# Patient Record
Sex: Female | Born: 1950
Health system: Southern US, Community
[De-identification: ages and names within clinical notes are randomized; demographics above are authoritative.]

---

## 2000-01-22 ENCOUNTER — Encounter: Admission: RE | Admit: 2000-01-22 | Discharge: 2000-01-22 | Payer: Self-pay | Admitting: Obstetrics and Gynecology

## 2000-01-22 ENCOUNTER — Encounter: Payer: Self-pay | Admitting: Obstetrics and Gynecology

## 2000-03-12 ENCOUNTER — Encounter: Admission: RE | Admit: 2000-03-12 | Discharge: 2000-03-12 | Payer: Self-pay | Admitting: Family Medicine

## 2000-03-12 ENCOUNTER — Encounter: Payer: Self-pay | Admitting: Family Medicine

## 2000-09-02 ENCOUNTER — Ambulatory Visit (HOSPITAL_COMMUNITY): Admission: RE | Admit: 2000-09-02 | Discharge: 2000-09-02 | Payer: Self-pay | Admitting: Gastroenterology

## 2001-02-25 ENCOUNTER — Encounter: Admission: RE | Admit: 2001-02-25 | Discharge: 2001-02-25 | Payer: Self-pay | Admitting: Obstetrics and Gynecology

## 2001-02-25 ENCOUNTER — Encounter: Payer: Self-pay | Admitting: Obstetrics and Gynecology

## 2015-12-26 DIAGNOSIS — H8110 Benign paroxysmal vertigo, unspecified ear: Secondary | ICD-10-CM | POA: Insufficient documentation

## 2015-12-26 DIAGNOSIS — M8580 Other specified disorders of bone density and structure, unspecified site: Secondary | ICD-10-CM | POA: Insufficient documentation

## 2015-12-26 DIAGNOSIS — E039 Hypothyroidism, unspecified: Secondary | ICD-10-CM | POA: Insufficient documentation

## 2015-12-26 DIAGNOSIS — E559 Vitamin D deficiency, unspecified: Secondary | ICD-10-CM | POA: Insufficient documentation

## 2016-04-11 DIAGNOSIS — Z0000 Encounter for general adult medical examination without abnormal findings: Secondary | ICD-10-CM | POA: Insufficient documentation

## 2016-05-13 ENCOUNTER — Ambulatory Visit
Admission: RE | Admit: 2016-05-13 | Discharge: 2016-05-13 | Disposition: A | Discharge status: Home / Self Care | Payer: BLUE CROSS/BLUE SHIELD | Source: Ambulatory Visit | Attending: Physical Medicine and Rehabilitation | Admitting: Physical Medicine and Rehabilitation

## 2016-05-13 ENCOUNTER — Other Ambulatory Visit: Payer: Self-pay | Admitting: Physical Medicine and Rehabilitation

## 2016-05-13 DIAGNOSIS — M5431 Sciatica, right side: Secondary | ICD-10-CM

## 2016-06-05 DIAGNOSIS — R69 Illness, unspecified: Secondary | ICD-10-CM | POA: Diagnosis not present

## 2016-06-05 DIAGNOSIS — M545 Low back pain: Secondary | ICD-10-CM | POA: Diagnosis not present

## 2016-06-05 DIAGNOSIS — E2839 Other primary ovarian failure: Secondary | ICD-10-CM | POA: Diagnosis not present

## 2016-06-05 DIAGNOSIS — N951 Menopausal and female climacteric states: Secondary | ICD-10-CM | POA: Diagnosis not present

## 2016-06-05 DIAGNOSIS — G894 Chronic pain syndrome: Secondary | ICD-10-CM | POA: Diagnosis not present

## 2016-06-05 DIAGNOSIS — N941 Unspecified dyspareunia: Secondary | ICD-10-CM | POA: Diagnosis not present

## 2016-06-05 DIAGNOSIS — M47817 Spondylosis without myelopathy or radiculopathy, lumbosacral region: Secondary | ICD-10-CM | POA: Diagnosis not present

## 2016-07-10 DIAGNOSIS — E2749 Other adrenocortical insufficiency: Secondary | ICD-10-CM | POA: Diagnosis not present

## 2016-07-10 DIAGNOSIS — E2839 Other primary ovarian failure: Secondary | ICD-10-CM | POA: Diagnosis not present

## 2016-07-10 DIAGNOSIS — E038 Other specified hypothyroidism: Secondary | ICD-10-CM | POA: Diagnosis not present

## 2016-07-18 DIAGNOSIS — E2839 Other primary ovarian failure: Secondary | ICD-10-CM | POA: Diagnosis not present

## 2016-07-18 DIAGNOSIS — M545 Low back pain: Secondary | ICD-10-CM | POA: Diagnosis not present

## 2016-07-18 DIAGNOSIS — M47817 Spondylosis without myelopathy or radiculopathy, lumbosacral region: Secondary | ICD-10-CM | POA: Diagnosis not present

## 2016-07-18 DIAGNOSIS — N951 Menopausal and female climacteric states: Secondary | ICD-10-CM | POA: Diagnosis not present

## 2016-07-18 DIAGNOSIS — M79651 Pain in right thigh: Secondary | ICD-10-CM | POA: Diagnosis not present

## 2016-07-18 DIAGNOSIS — G894 Chronic pain syndrome: Secondary | ICD-10-CM | POA: Diagnosis not present

## 2016-07-18 DIAGNOSIS — N941 Unspecified dyspareunia: Secondary | ICD-10-CM | POA: Diagnosis not present

## 2016-07-31 DIAGNOSIS — Z23 Encounter for immunization: Secondary | ICD-10-CM | POA: Diagnosis not present

## 2016-08-07 DIAGNOSIS — Z86010 Personal history of colonic polyps: Secondary | ICD-10-CM | POA: Diagnosis not present

## 2016-08-21 DIAGNOSIS — E2839 Other primary ovarian failure: Secondary | ICD-10-CM | POA: Diagnosis not present

## 2016-08-21 DIAGNOSIS — E038 Other specified hypothyroidism: Secondary | ICD-10-CM | POA: Diagnosis not present

## 2016-08-21 DIAGNOSIS — E663 Overweight: Secondary | ICD-10-CM | POA: Diagnosis not present

## 2016-08-21 DIAGNOSIS — E2749 Other adrenocortical insufficiency: Secondary | ICD-10-CM | POA: Diagnosis not present

## 2017-04-16 DIAGNOSIS — Z1231 Encounter for screening mammogram for malignant neoplasm of breast: Secondary | ICD-10-CM | POA: Diagnosis not present

## 2017-04-16 DIAGNOSIS — Z6824 Body mass index (BMI) 24.0-24.9, adult: Secondary | ICD-10-CM | POA: Diagnosis not present

## 2017-04-16 DIAGNOSIS — E559 Vitamin D deficiency, unspecified: Secondary | ICD-10-CM | POA: Diagnosis not present

## 2017-04-16 DIAGNOSIS — Z01419 Encounter for gynecological examination (general) (routine) without abnormal findings: Secondary | ICD-10-CM | POA: Diagnosis not present

## 2017-04-16 DIAGNOSIS — Z0000 Encounter for general adult medical examination without abnormal findings: Secondary | ICD-10-CM | POA: Diagnosis not present

## 2017-04-21 DIAGNOSIS — Z0000 Encounter for general adult medical examination without abnormal findings: Secondary | ICD-10-CM | POA: Diagnosis not present

## 2017-04-21 DIAGNOSIS — Z23 Encounter for immunization: Secondary | ICD-10-CM | POA: Diagnosis not present

## 2017-06-07 IMAGING — CR DG LUMBAR SPINE COMPLETE W/ BEND
7 series · 7 of 7 positions shown · non-contrast
Comparison: No recent prior.

CLINICAL DATA: Back pain.

EXAM:
LUMBAR SPINE - COMPLETE WITH BENDING VIEWS

[w lumbar spine ap]
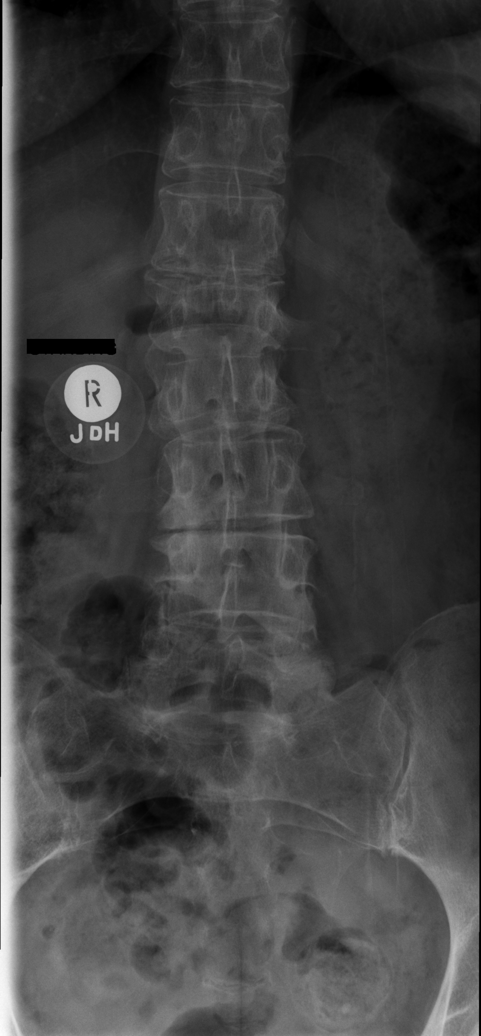

[w lumbar spine obl (1 of 2)]
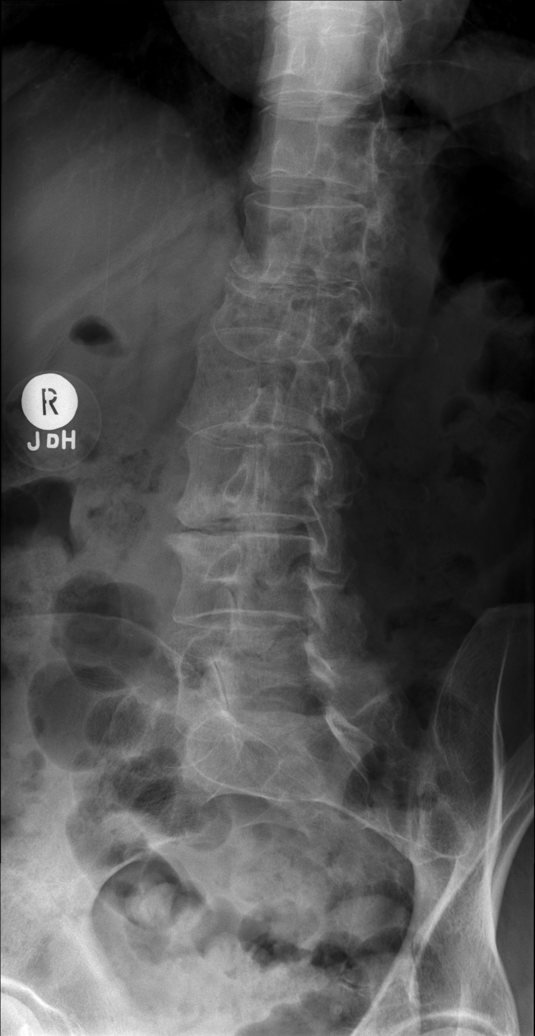

[w lumbar spine obl (2 of 2)]
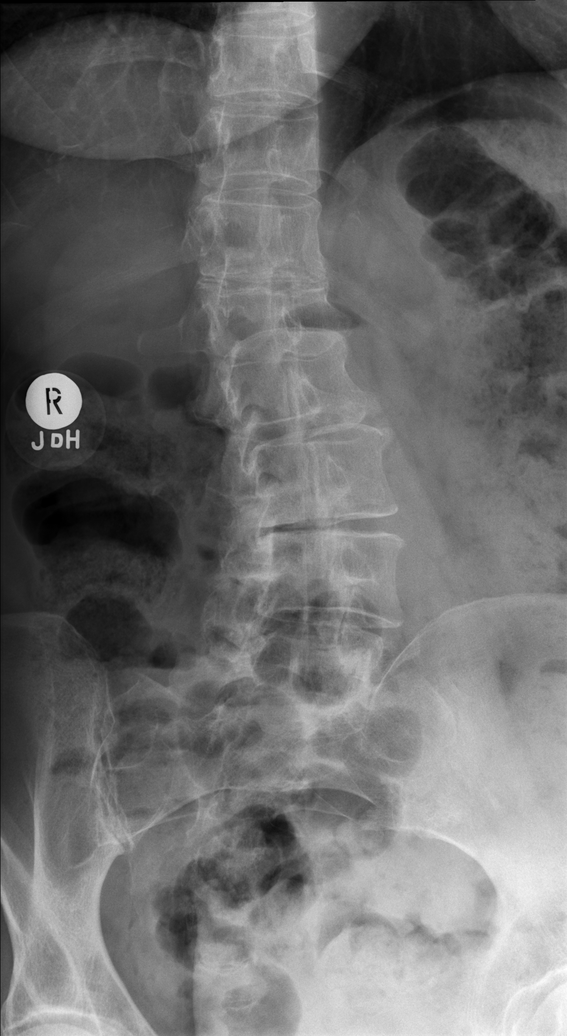

[w lumbar spine lat]
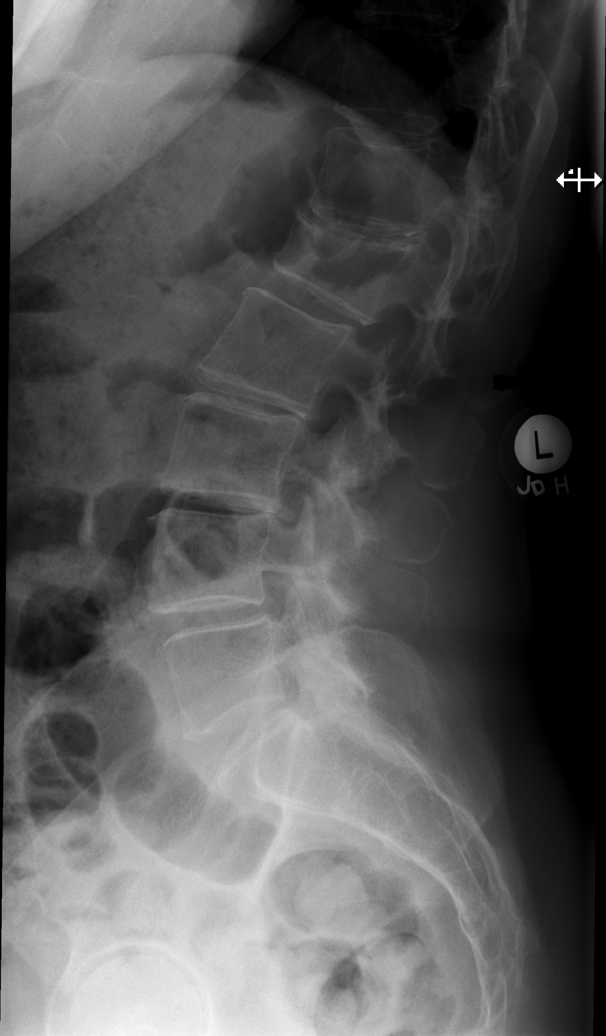

[w lumbar l-5 s-1 spot]
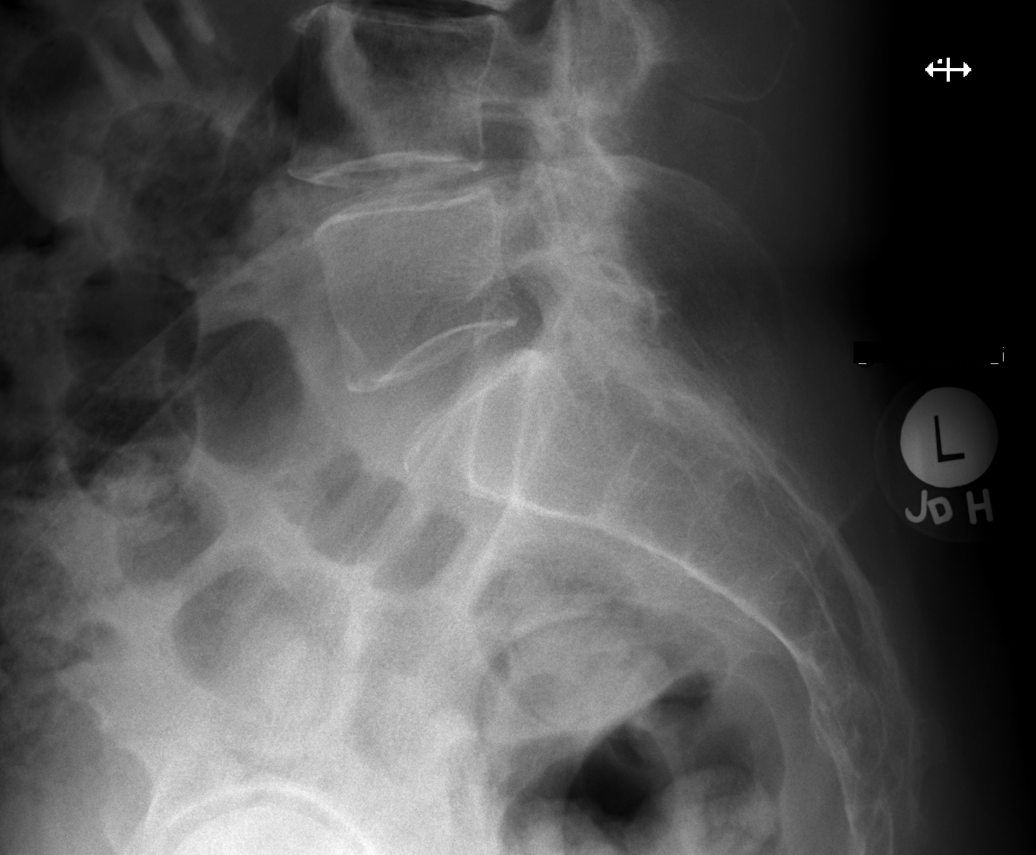

[w lumbar spine flexion]
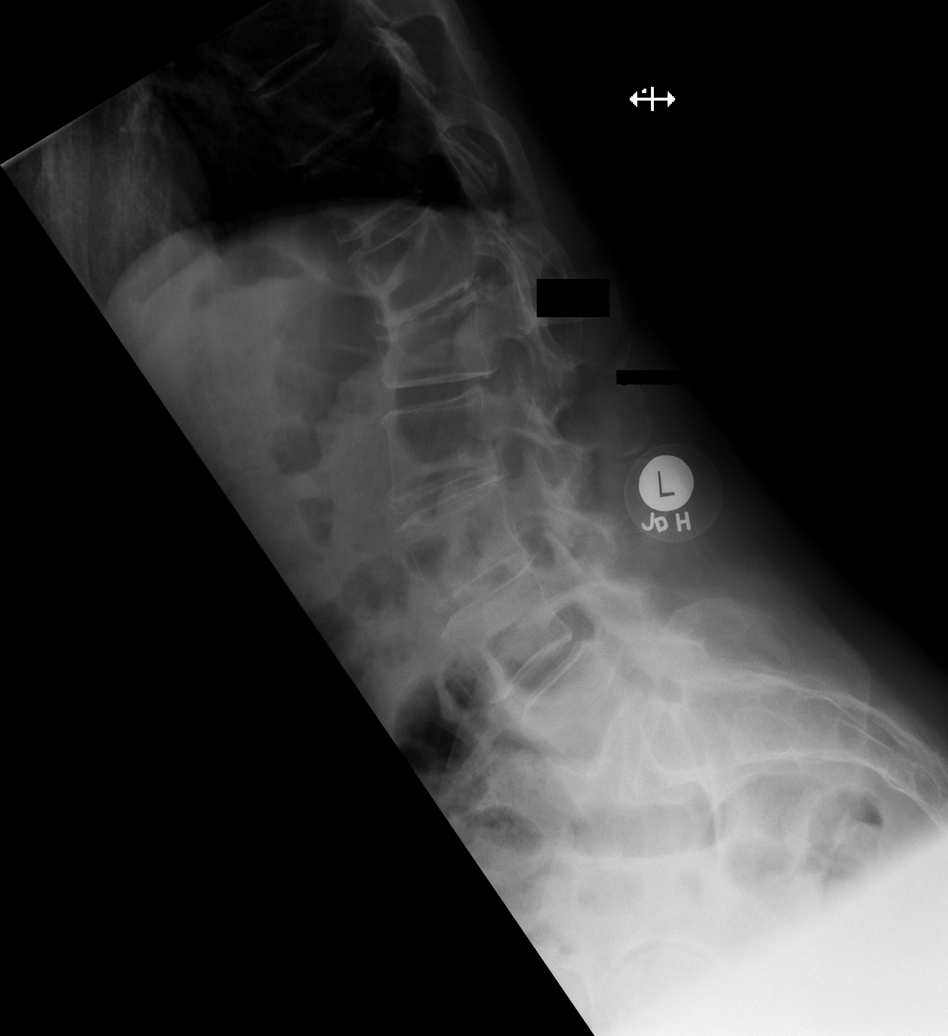

[w lumbar spine extension]
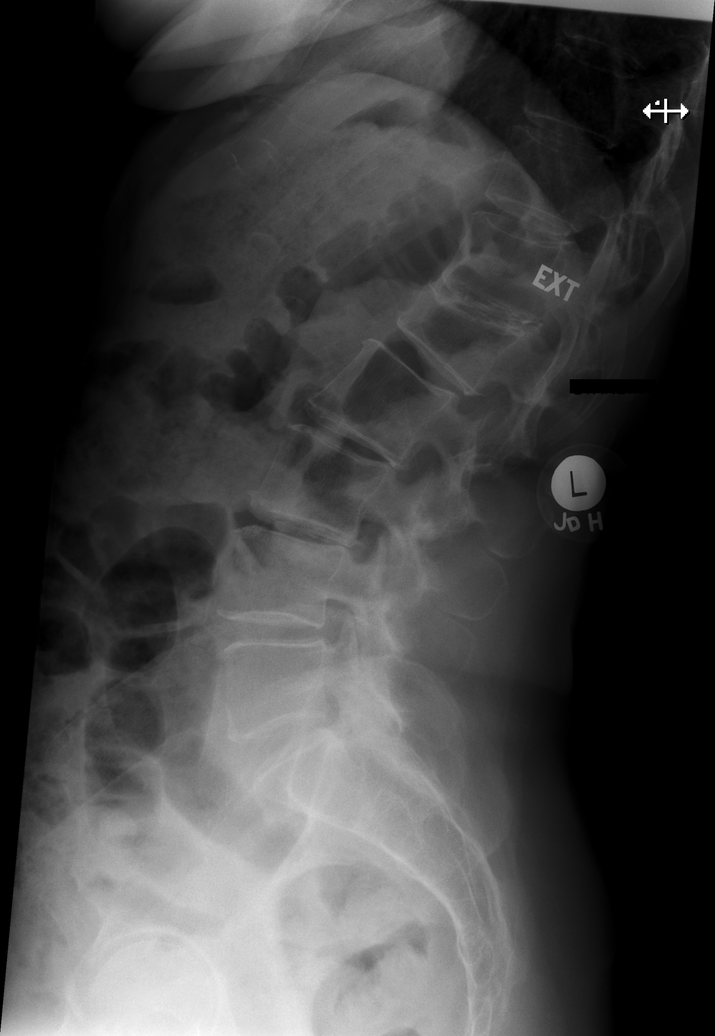

[7 of 7 positions shown; findings below may reference images not displayed]

FINDINGS: Lumbar vertebra numbered with the lowest segmented appearing lumbar
vertebral lateral view as L5. Lumbar scoliosis concave left. Mild
moderate L1 compression fracture, age undetermined. Diffuse
multilevel severe degenerative change. 3 mm anterolisthesis L4 on
L5. No flexion or extension instability noted.
IMPRESSION: 1. Diffuse multilevel degenerative change and scoliosis concave
left. 3 mm anterolisthesis L4 on L5. No flexion or extension
instability noted.

2.  L1 mild moderate compression fracture, age undetermined .

## 2017-06-16 DIAGNOSIS — H8110 Benign paroxysmal vertigo, unspecified ear: Secondary | ICD-10-CM | POA: Diagnosis not present

## 2017-07-09 DIAGNOSIS — M8589 Other specified disorders of bone density and structure, multiple sites: Secondary | ICD-10-CM | POA: Diagnosis not present

## 2017-09-08 DIAGNOSIS — N898 Other specified noninflammatory disorders of vagina: Secondary | ICD-10-CM | POA: Diagnosis not present

## 2017-09-08 DIAGNOSIS — N930 Postcoital and contact bleeding: Secondary | ICD-10-CM | POA: Diagnosis not present

## 2017-09-16 DIAGNOSIS — N930 Postcoital and contact bleeding: Secondary | ICD-10-CM | POA: Diagnosis not present

## 2017-09-16 DIAGNOSIS — N950 Postmenopausal bleeding: Secondary | ICD-10-CM | POA: Diagnosis not present

## 2017-11-24 DIAGNOSIS — N930 Postcoital and contact bleeding: Secondary | ICD-10-CM | POA: Diagnosis not present

## 2017-11-24 DIAGNOSIS — N950 Postmenopausal bleeding: Secondary | ICD-10-CM | POA: Diagnosis not present

## 2018-01-22 DIAGNOSIS — J309 Allergic rhinitis, unspecified: Secondary | ICD-10-CM | POA: Diagnosis not present

## 2018-01-22 DIAGNOSIS — R05 Cough: Secondary | ICD-10-CM | POA: Diagnosis not present

## 2018-04-22 DIAGNOSIS — Z01419 Encounter for gynecological examination (general) (routine) without abnormal findings: Secondary | ICD-10-CM | POA: Diagnosis not present

## 2018-04-22 DIAGNOSIS — Z1231 Encounter for screening mammogram for malignant neoplasm of breast: Secondary | ICD-10-CM | POA: Diagnosis not present

## 2018-04-22 DIAGNOSIS — R69 Illness, unspecified: Secondary | ICD-10-CM | POA: Diagnosis not present

## 2018-04-30 DIAGNOSIS — E039 Hypothyroidism, unspecified: Secondary | ICD-10-CM | POA: Diagnosis not present

## 2018-04-30 DIAGNOSIS — E559 Vitamin D deficiency, unspecified: Secondary | ICD-10-CM | POA: Diagnosis not present

## 2018-04-30 DIAGNOSIS — Z0000 Encounter for general adult medical examination without abnormal findings: Secondary | ICD-10-CM | POA: Diagnosis not present

## 2018-05-06 DIAGNOSIS — R2242 Localized swelling, mass and lump, left lower limb: Secondary | ICD-10-CM | POA: Insufficient documentation

## 2018-05-06 DIAGNOSIS — Z0000 Encounter for general adult medical examination without abnormal findings: Secondary | ICD-10-CM | POA: Diagnosis not present

## 2018-05-15 ENCOUNTER — Ambulatory Visit: Payer: 59 | Admitting: Podiatry

## 2018-05-27 ENCOUNTER — Ambulatory Visit: Payer: 59 | Admitting: Podiatry

## 2018-05-27 ENCOUNTER — Encounter: Payer: Self-pay | Admitting: Podiatry

## 2018-05-27 ENCOUNTER — Other Ambulatory Visit: Payer: Self-pay | Admitting: Podiatry

## 2018-05-27 ENCOUNTER — Ambulatory Visit (INDEPENDENT_AMBULATORY_CARE_PROVIDER_SITE_OTHER): Payer: 59

## 2018-05-27 VITALS — BP 96/63 | HR 72 | Resp 16

## 2018-05-27 DIAGNOSIS — M722 Plantar fascial fibromatosis: Secondary | ICD-10-CM

## 2018-05-27 DIAGNOSIS — M79672 Pain in left foot: Secondary | ICD-10-CM

## 2018-05-27 DIAGNOSIS — M2041 Other hammer toe(s) (acquired), right foot: Secondary | ICD-10-CM

## 2018-05-27 NOTE — Progress Notes (Signed)
   Subjective:    Patient ID: Deborah Brady, female    DOB: Nov 29, 1950, 67 y.o.   MRN: [id number]  HPI    Review of Systems  All other systems reviewed and are negative.      Objective:   Physical Exam        Assessment & Plan:

## 2018-05-28 NOTE — Progress Notes (Signed)
Subjective:   Patient ID: Deborah Brady, female   DOB: 67 y.o.   MRN: [id number]   HPI Patient presents stating that she has a nodule in the left arch that is been present for around a year and while it does not hurt at times her foot feels tight.  Patient does not smoke and likes to be active   Review of Systems  All other systems reviewed and are negative.       Objective:  Physical Exam  Constitutional: She appears well-developed and well-nourished.  Cardiovascular: Intact distal pulses.  Pulmonary/Chest: Effort normal.  Musculoskeletal: Normal range of motion.  Neurological: She is alert.  Skin: Skin is warm.  Nursing note and vitals reviewed.   Neurovascular status intact muscle strength is adequate range of motion within normal limits with patient found to have a nodule in the left mid arch area that does not have any color changes and is not painful when palpated except for deep deep palpation which is localized.  It measures 1.8 cm in width by 2.3 cm in length and it is local to this area with no proximal indication of extension.  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  I did explain that we could remove it for MRI and she denies wanting either of these treatments and will watch it and if it grows in size changes color or becomes painful it will need to be dealt with  X-ray indicates that there is no calcification or indications of bony extension

## 2019-05-13 DIAGNOSIS — E559 Vitamin D deficiency, unspecified: Secondary | ICD-10-CM | POA: Diagnosis not present

## 2019-05-13 DIAGNOSIS — E039 Hypothyroidism, unspecified: Secondary | ICD-10-CM | POA: Diagnosis not present

## 2019-05-17 DIAGNOSIS — Z6824 Body mass index (BMI) 24.0-24.9, adult: Secondary | ICD-10-CM | POA: Diagnosis not present

## 2019-05-17 DIAGNOSIS — Z01419 Encounter for gynecological examination (general) (routine) without abnormal findings: Secondary | ICD-10-CM | POA: Diagnosis not present

## 2019-05-17 DIAGNOSIS — Z1231 Encounter for screening mammogram for malignant neoplasm of breast: Secondary | ICD-10-CM | POA: Diagnosis not present

## 2019-05-18 DIAGNOSIS — Z0000 Encounter for general adult medical examination without abnormal findings: Secondary | ICD-10-CM | POA: Diagnosis not present

## 2019-06-18 DIAGNOSIS — H2513 Age-related nuclear cataract, bilateral: Secondary | ICD-10-CM | POA: Diagnosis not present

## 2019-06-18 DIAGNOSIS — H5213 Myopia, bilateral: Secondary | ICD-10-CM | POA: Diagnosis not present

## 2020-05-31 DIAGNOSIS — Z0000 Encounter for general adult medical examination without abnormal findings: Secondary | ICD-10-CM | POA: Diagnosis not present

## 2020-05-31 DIAGNOSIS — Z6824 Body mass index (BMI) 24.0-24.9, adult: Secondary | ICD-10-CM | POA: Diagnosis not present

## 2020-05-31 DIAGNOSIS — Z01419 Encounter for gynecological examination (general) (routine) without abnormal findings: Secondary | ICD-10-CM | POA: Diagnosis not present

## 2020-05-31 DIAGNOSIS — E559 Vitamin D deficiency, unspecified: Secondary | ICD-10-CM | POA: Diagnosis not present

## 2020-05-31 DIAGNOSIS — Z1231 Encounter for screening mammogram for malignant neoplasm of breast: Secondary | ICD-10-CM | POA: Diagnosis not present

## 2020-06-06 DIAGNOSIS — Z0000 Encounter for general adult medical examination without abnormal findings: Secondary | ICD-10-CM | POA: Diagnosis not present

## 2023-07-22 ENCOUNTER — Other Ambulatory Visit (HOSPITAL_BASED_OUTPATIENT_CLINIC_OR_DEPARTMENT_OTHER): Payer: Self-pay

## 2023-07-22 MED ORDER — INFLUENZA VAC A&B SURF ANT ADJ 0.5 ML IM SUSY
0.5000 mL | PREFILLED_SYRINGE | Freq: Once | INTRAMUSCULAR | 0 refills | Status: AC
  Filled 2023-07-22: qty 0.5, 1d supply, fill #0
